# Patient Record
Sex: Female | Born: 1945
Health system: Southern US, Community
[De-identification: ages and names within clinical notes are randomized; demographics above are authoritative.]

## PROBLEM LIST (undated history)

## (undated) DIAGNOSIS — I499 Cardiac arrhythmia, unspecified: Secondary | ICD-10-CM

## (undated) DIAGNOSIS — M4800 Spinal stenosis, site unspecified: Secondary | ICD-10-CM

## (undated) DIAGNOSIS — R9409 Abnormal results of other function studies of central nervous system: Secondary | ICD-10-CM

## (undated) DIAGNOSIS — I10 Essential (primary) hypertension: Secondary | ICD-10-CM

## (undated) HISTORY — PX: BREAST SURGERY: SHX581

## (undated) HISTORY — PX: THORACIC OUTLET SURGERY: SHX2502

## (undated) HISTORY — PX: CATARACT EXTRACTION: SUR2

## (undated) HISTORY — PX: TUBAL LIGATION: SHX77

---

## 2014-10-05 ENCOUNTER — Encounter (HOSPITAL_COMMUNITY): Payer: Self-pay

## 2014-10-05 ENCOUNTER — Emergency Department (HOSPITAL_COMMUNITY)
Admission: EM | Admit: 2014-10-05 | Discharge: 2014-10-05 | Disposition: A | Discharge status: Home / Self Care | Payer: Medicare Other | Attending: Emergency Medicine | Admitting: Emergency Medicine

## 2014-10-05 DIAGNOSIS — Z87820 Personal history of traumatic brain injury: Secondary | ICD-10-CM | POA: Insufficient documentation

## 2014-10-05 DIAGNOSIS — Z8739 Personal history of other diseases of the musculoskeletal system and connective tissue: Secondary | ICD-10-CM | POA: Diagnosis not present

## 2014-10-05 DIAGNOSIS — Z79899 Other long term (current) drug therapy: Secondary | ICD-10-CM | POA: Diagnosis not present

## 2014-10-05 DIAGNOSIS — R5383 Other fatigue: Secondary | ICD-10-CM | POA: Diagnosis present

## 2014-10-05 DIAGNOSIS — I10 Essential (primary) hypertension: Secondary | ICD-10-CM | POA: Diagnosis not present

## 2014-10-05 DIAGNOSIS — R55 Syncope and collapse: Secondary | ICD-10-CM | POA: Insufficient documentation

## 2014-10-05 HISTORY — DX: Abnormal results of other function studies of central nervous system: R94.09

## 2014-10-05 HISTORY — DX: Cardiac arrhythmia, unspecified: I49.9

## 2014-10-05 HISTORY — DX: Essential (primary) hypertension: I10

## 2014-10-05 HISTORY — DX: Spinal stenosis, site unspecified: M48.00

## 2014-10-05 LAB — CBC WITH DIFFERENTIAL/PLATELET
Basophils Absolute: 0 10*3/uL (ref 0.0–0.1)
Basophils Relative: 0 % (ref 0–1)
Eosinophils Absolute: 0.2 10*3/uL (ref 0.0–0.7)
Eosinophils Relative: 3 % (ref 0–5)
HCT: 42.3 % (ref 36.0–46.0)
Hemoglobin: 14.3 g/dL (ref 12.0–15.0)
Lymphocytes Relative: 24 % (ref 12–46)
Lymphs Abs: 1.5 10*3/uL (ref 0.7–4.0)
MCH: 29.4 pg (ref 26.0–34.0)
MCHC: 33.8 g/dL (ref 30.0–36.0)
MCV: 87 fL (ref 78.0–100.0)
Monocytes Absolute: 0.3 10*3/uL (ref 0.1–1.0)
Monocytes Relative: 6 % (ref 3–12)
Neutro Abs: 4 10*3/uL (ref 1.7–7.7)
Neutrophils Relative %: 67 % (ref 43–77)
Platelets: 209 10*3/uL (ref 150–400)
RBC: 4.86 MIL/uL (ref 3.87–5.11)
RDW: 12.7 % (ref 11.5–15.5)
WBC: 6 10*3/uL (ref 4.0–10.5)

## 2014-10-05 LAB — COMPREHENSIVE METABOLIC PANEL
ALT: 12 U/L (ref 0–35)
AST: 19 U/L (ref 0–37)
Albumin: 4.1 g/dL (ref 3.5–5.2)
Alkaline Phosphatase: 86 U/L (ref 39–117)
Anion gap: 12 (ref 5–15)
BUN: 12 mg/dL (ref 6–23)
CO2: 24 mEq/L (ref 19–32)
Calcium: 9.5 mg/dL (ref 8.4–10.5)
Chloride: 103 mEq/L (ref 96–112)
Creatinine, Ser: 0.84 mg/dL (ref 0.50–1.10)
GFR calc Af Amer: 83 mL/min — ABNORMAL LOW (ref >90)
GFR calc non Af Amer: 71 mL/min — ABNORMAL LOW (ref >90)
Glucose, Bld: 120 mg/dL — ABNORMAL HIGH (ref 70–99)
Potassium: 4.5 mEq/L (ref 3.7–5.3)
Sodium: 139 mEq/L (ref 137–147)
Total Bilirubin: 0.5 mg/dL (ref 0.3–1.2)
Total Protein: 7.5 g/dL (ref 6.0–8.3)

## 2014-10-05 LAB — URINALYSIS, ROUTINE W REFLEX MICROSCOPIC
Bilirubin Urine: NEGATIVE
Glucose, UA: NEGATIVE mg/dL
Hgb urine dipstick: NEGATIVE
Ketones, ur: NEGATIVE mg/dL
Leukocytes, UA: NEGATIVE
Nitrite: NEGATIVE
Protein, ur: NEGATIVE mg/dL
Specific Gravity, Urine: 1.008 (ref 1.005–1.030)
Urobilinogen, UA: 1 mg/dL (ref 0.0–1.0)
pH: 5.5 (ref 5.0–8.0)

## 2014-10-05 LAB — I-STAT TROPONIN, ED: Troponin i, poc: 0 ng/mL (ref 0.00–0.08)

## 2014-10-05 MED ORDER — SODIUM CHLORIDE 0.9 % IV BOLUS (SEPSIS)
1000.0000 mL | Freq: Once | INTRAVENOUS | Status: AC
  Administered 2014-10-05: 1000 mL via INTRAVENOUS

## 2014-10-05 NOTE — ED Notes (Addendum)
[place]. States [place] was at the bank about 1120 this am and suddenly didn't feel right.  States she got really hot and like her head switched off causing her to go limp.  States she was awake the whole time and remembers everything.  Bystanders called ems.  Denies having had pain nor has pain now.  States she felt like air being let out of a tire but felt better soon afterwards and states she feels fine now.  States this has happened to her 3 different times in the past.  Last time was 8-10 years ago but no one could ever figure out what caused it.
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
## 2014-10-05 NOTE — ED Notes (Signed)
Bed: WA09 Expected date:  Expected time:  Means of arrival:  Comments: "her head switched off", no syncope

## 2014-10-05 NOTE — Discharge Instructions (Signed)
Return here as needed.  Follow-up with doctor Provided or one of your choosing.  Increase your fluid intake

## 2014-10-07 LAB — URINE CULTURE: Colony Count: 40000

## 2017-12-02 ENCOUNTER — Encounter: Payer: Self-pay | Admitting: Gastroenterology

## 2018-01-06 ENCOUNTER — Ambulatory Visit: Payer: Medicare Other | Admitting: Gastroenterology

## 2020-01-03 ENCOUNTER — Encounter: Payer: Self-pay | Admitting: *Deleted

## 2020-01-03 ENCOUNTER — Ambulatory Visit: Payer: Medicare Other | Admitting: Cardiovascular Disease

## 2020-01-03 ENCOUNTER — Encounter (INDEPENDENT_AMBULATORY_CARE_PROVIDER_SITE_OTHER): Payer: Self-pay

## 2020-01-03 ENCOUNTER — Encounter: Payer: Self-pay | Admitting: Cardiovascular Disease

## 2020-01-03 ENCOUNTER — Ambulatory Visit (INDEPENDENT_AMBULATORY_CARE_PROVIDER_SITE_OTHER): Payer: Medicare Other | Admitting: Cardiovascular Disease

## 2020-01-03 ENCOUNTER — Other Ambulatory Visit: Payer: Self-pay

## 2020-01-03 VITALS — BP 120/90 | HR 72 | Temp 97.2°F | Ht 60.0 in | Wt 144.0 lb

## 2020-01-03 DIAGNOSIS — R002 Palpitations: Secondary | ICD-10-CM

## 2020-01-03 DIAGNOSIS — I10 Essential (primary) hypertension: Secondary | ICD-10-CM | POA: Diagnosis not present

## 2020-01-03 DIAGNOSIS — I471 Supraventricular tachycardia: Secondary | ICD-10-CM

## 2020-01-03 DIAGNOSIS — E782 Mixed hyperlipidemia: Secondary | ICD-10-CM | POA: Diagnosis not present

## 2020-01-03 NOTE — Patient Instructions (Signed)
Medication Instructions:  Your physician recommends that you continue on your current medications as directed. Please refer to the Current Medication list given to you today.  *If you need a refill on your cardiac medications before your next appointment, please call your pharmacy*  Lab Work: NONE   Testing/Procedures: ZIO PATCH FOR 3 DAYS  Follow-Up: At [facility], you and your health needs are our priority.  As part of our continuing mission to provide you with exceptional heart care, we have created designated Provider Care Teams.  These Care Teams include your primary Cardiologist (physician) and Advanced Practice Providers (APPs -  Physician Assistants and Nurse Practitioners) who all work together to provide you with the care you need, when you need it.  Your next appointment:   2 MONTHS   The format for your next appointment:   In Person  Provider:   You may see DR [facility]  or one of the following Advanced Practice Providers on your designated Care Team:    Kerin Ransom, PA-C  [place], [place]  Coletta Memos, [place]   Other Instructions  [place] Monitor Instructions   Your physician has requested you wear your ZIO patch monitor_______days.   This is a single patch monitor.  [employer] supplies one patch monitor per enrollment.  Additional stickers are not available.   Please do not apply patch if you will be having a Nuclear Stress Test, Echocardiogram, Cardiac CT, MRI, or Chest Xray during the time frame you would be wearing the monitor. The patch cannot be worn during these tests.  You cannot remove and re-apply the ZIO XT patch monitor.   Your ZIO patch monitor will be sent USPS Priority mail from [facility] directly to your home address. The monitor may also be mailed to a PO BOX if home delivery is not available.   It may take 3-5 days to receive your monitor after you have been enrolled.   Once you have received you monitor, please review  enclosed instructions.  Your monitor has already been registered assigning a specific monitor serial # to you.   Applying the monitor   Shave hair from upper left chest.   Hold abrader disc by orange tab.  Rub abrader in 40 strokes over left upper chest as indicated in your monitor instructions.   Clean area with 4 enclosed alcohol pads .  Use all pads to assure are is cleaned thoroughly.  Let dry.   Apply patch as indicated in monitor instructions.  Patch will be place under collarbone on left side of chest with arrow pointing upward.   Rub patch adhesive wings for 2 minutes.Remove white label marked "1".  Remove white label marked "2".  Rub patch adhesive wings for 2 additional minutes.   While looking in a mirror, press and release button in center of patch.  A small green light will flash 3-4 times .  This will be your only indicator the monitor has been turned on.     Do not shower for the first 24 hours.  You may shower after the first 24 hours.   Press button if you feel a symptom. You will hear a small click.  Record Date, Time and Symptom in the Patient Log Book.   When you are ready to remove patch, follow instructions on last 2 pages of Patient Log Book.  Stick patch monitor onto last page of Patient Log Book.   Place Patient Log Book in [place] box.  Use  locking tab on box and tape box closed securely.  The [employer] has [employer] on it.  Please place in mailbox as soon as possible.  Your physician should have your test results approximately 7 days after the monitor has been mailed back to [facility].   Call [facility] Customer Care at [phone] if you have questions regarding your ZIO XT patch monitor.  Call them immediately if you see an orange light blinking on your monitor.   If your monitor falls off in less than 4 days contact our Monitor department at [phone].  If your monitor becomes loose or falls off after 4 days call [employer] at  [phone] for suggestions on securing your monitor.
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
## 2020-01-03 NOTE — Progress Notes (Signed)
Patient ID: Sara Strickland, female   DOB: [birth year], 74 y.o.   MRN: [id number] Patient enrolled for 3 day ZIO XT long term holter monitor to be mailed to her home.

## 2020-01-09 ENCOUNTER — Encounter: Payer: Self-pay | Admitting: Neurology

## 2020-01-09 ENCOUNTER — Other Ambulatory Visit: Payer: Self-pay

## 2020-01-09 ENCOUNTER — Ambulatory Visit: Payer: Medicare Other | Admitting: Neurology

## 2020-01-11 ENCOUNTER — Other Ambulatory Visit: Payer: Self-pay

## 2020-01-11 ENCOUNTER — Other Ambulatory Visit (INDEPENDENT_AMBULATORY_CARE_PROVIDER_SITE_OTHER): Payer: Medicare Other

## 2020-01-11 ENCOUNTER — Ambulatory Visit: Payer: Medicare Other | Admitting: Neurology

## 2020-01-11 ENCOUNTER — Encounter: Payer: Self-pay | Admitting: Neurology

## 2020-01-11 VITALS — BP 122/84 | HR 61 | Temp 97.0°F | Ht 60.0 in | Wt 143.0 lb

## 2020-01-11 DIAGNOSIS — G250 Essential tremor: Secondary | ICD-10-CM | POA: Diagnosis not present

## 2020-01-11 DIAGNOSIS — R002 Palpitations: Secondary | ICD-10-CM | POA: Diagnosis not present

## 2020-01-11 NOTE — Patient Instructions (Signed)
You have a Hand tremor of both hands as well as your head, your history and examination are supportive of the diagnosis of essential tremor. I do not see any signs or symptoms of parkinson's like disease or what we call parkinsonism.   For your tremor, I would not recommend any new medication for fear of side effects (especially Since you have already tried a few medications and had side effects with primidone.  You are currently on a beta-blocker which is also considered a first-line treatment for essential tremor.  We can continue to monitor your symptoms and examination.  Please follow-up in about 6 months to see one of our nurse practitioners.  Please remember, that any kind of tremor may be exacerbated by anxiety, anger, nervousness, excitement, dehydration, sleep deprivation, by caffeine, Thyroid disease, and low blood sugar values or blood sugar fluctuations. Some medications can exacerbate tremors.
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
## 2020-01-20 ENCOUNTER — Encounter: Payer: Self-pay | Admitting: Cardiovascular Disease

## 2020-01-20 DIAGNOSIS — R002 Palpitations: Secondary | ICD-10-CM

## 2020-01-20 DIAGNOSIS — I471 Supraventricular tachycardia: Secondary | ICD-10-CM

## 2020-01-20 DIAGNOSIS — I10 Essential (primary) hypertension: Secondary | ICD-10-CM

## 2020-01-20 DIAGNOSIS — E785 Hyperlipidemia, unspecified: Secondary | ICD-10-CM

## 2020-01-20 HISTORY — DX: Hyperlipidemia, unspecified: E78.5

## 2020-01-20 HISTORY — DX: Essential (primary) hypertension: I10

## 2020-01-20 HISTORY — DX: Supraventricular tachycardia: I47.1

## 2020-01-20 HISTORY — DX: Palpitations: R00.2

## 2020-02-02 ENCOUNTER — Ambulatory Visit: Payer: Medicare Other | Attending: Internal Medicine

## 2020-02-02 DIAGNOSIS — Z23 Encounter for immunization: Secondary | ICD-10-CM

## 2020-02-02 NOTE — Progress Notes (Signed)
   Covid-19 Vaccination Clinic  Name:  Sara Strickland    MRN: [id number] DOB: 06/23/1946  02/02/2020  [name] was observed post Covid-19 immunization for 15 minutes without incident. She was provided with Vaccine Information Sheet and instruction to access the V-Safe system.   [name] was instructed to call 911 with any severe reactions post vaccine: Marland Kitchen Difficulty breathing  . Swelling of face and throat  . A fast heartbeat  . A bad rash all over body  . Dizziness and weakness   Immunizations Administered    Name Date Dose VIS Date Route   Pfizer COVID-19 Vaccine 02/02/2020  3:27 PM 0.3 mL 11/04/2019 Intramuscular   Manufacturer: [employer], [employer]   Lot: [id number]   NDC: [address]-0

## 2020-02-28 ENCOUNTER — Ambulatory Visit: Payer: Medicare Other | Attending: Internal Medicine

## 2020-02-28 DIAGNOSIS — Z23 Encounter for immunization: Secondary | ICD-10-CM

## 2020-02-28 NOTE — Progress Notes (Signed)
   Covid-19 Vaccination Clinic  Name:  Sara Strickland    MRN: [id number] DOB: 1946/04/16  02/28/2020  [name] was observed post Covid-19 immunization for 15 minutes without incident. She was provided with Vaccine Information Sheet and instruction to access the V-Safe system.   [name] was instructed to call 911 with any severe reactions post vaccine: Marland Kitchen Difficulty breathing  . Swelling of face and throat  . A fast heartbeat  . A bad rash all over body  . Dizziness and weakness   Immunizations Administered    Name Date Dose VIS Date Route   Pfizer COVID-19 Vaccine 02/28/2020 10:59 AM 0.3 mL 11/04/2019 Intramuscular   Manufacturer: [employer], [employer]   Lot: [id number]   NDC: [address]-0

## 2020-03-02 ENCOUNTER — Other Ambulatory Visit: Payer: Self-pay

## 2020-03-02 ENCOUNTER — Encounter: Payer: Self-pay | Admitting: Cardiovascular Disease

## 2020-03-02 ENCOUNTER — Ambulatory Visit: Payer: Medicare Other | Admitting: Cardiovascular Disease

## 2020-03-02 VITALS — BP 120/74 | HR 97 | Ht 60.0 in | Wt 141.4 lb

## 2020-03-02 DIAGNOSIS — I10 Essential (primary) hypertension: Secondary | ICD-10-CM

## 2020-03-02 DIAGNOSIS — E782 Mixed hyperlipidemia: Secondary | ICD-10-CM

## 2020-03-02 DIAGNOSIS — I471 Supraventricular tachycardia: Secondary | ICD-10-CM

## 2020-03-02 NOTE — Patient Instructions (Signed)
Medication Instructions:  Your physician recommends that you continue on your current medications as directed. Please refer to the Current Medication list given to you today.  *If you need a refill on your cardiac medications before your next appointment, please call your pharmacy*  Lab Work: NONE   Testing/Procedures: NONE   Follow-Up: At [facility], you and your health needs are our priority.  As part of our continuing mission to provide you with exceptional heart care, we have created designated Provider Care Teams.  These Care Teams include your primary Cardiologist (physician) and Advanced Practice Providers (APPs -  Physician Assistants and Nurse Practitioners) who all work together to provide you with the care you need, when you need it.  We recommend signing up for the patient portal called "MyChart".  Sign up information is provided on this After Visit Summary.  MyChart is used to connect with patients for Virtual Visits (Telemedicine).  Patients are able to view lab/test results, encounter notes, upcoming appointments, etc.  Non-urgent messages can be sent to your provider as well.   To learn more about what you can do with MyChart, go to [email or web address].    Your next appointment:   12 month(s)  The format for your next appointment:   In Person  Provider:   You may see DR [place] or one of the following Advanced Practice Providers on your designated Care Team:    Luke Kilroy, PA-C  Callie Goodrich, PA-C  Jesse Cleaver, FNP     
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
## 2020-04-06 ENCOUNTER — Other Ambulatory Visit: Payer: Self-pay | Admitting: Family Medicine

## 2020-04-06 DIAGNOSIS — Z1231 Encounter for screening mammogram for malignant neoplasm of breast: Secondary | ICD-10-CM

## 2020-04-10 ENCOUNTER — Other Ambulatory Visit: Payer: Self-pay

## 2020-04-10 ENCOUNTER — Ambulatory Visit
Admission: RE | Admit: 2020-04-10 | Discharge: 2020-04-10 | Disposition: A | Discharge status: Home / Self Care | Payer: Medicare Other | Source: Ambulatory Visit | Attending: Family Medicine | Admitting: Family Medicine

## 2020-04-10 DIAGNOSIS — Z1231 Encounter for screening mammogram for malignant neoplasm of breast: Secondary | ICD-10-CM

## 2020-06-05 IMAGING — MG DIGITAL SCREENING BILAT W/ TOMO W/ CAD
6 of 10 series · 6 of 30 positions shown · non-contrast
Comparison: Previous exam(s).

CLINICAL DATA: Screening.

EXAM:
DIGITAL SCREENING BILATERAL MAMMOGRAM WITH TOMO AND CAD

[R MLO synth-2D]
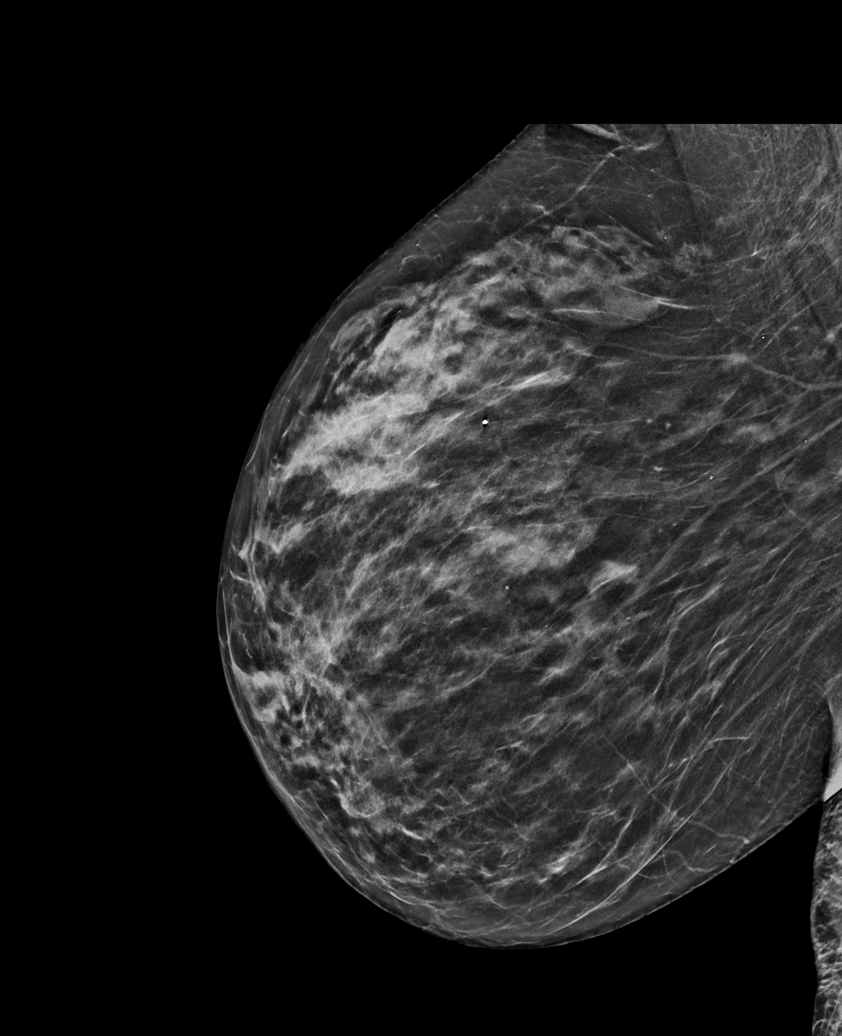

[L MLO synth-2D (1 of 2)]
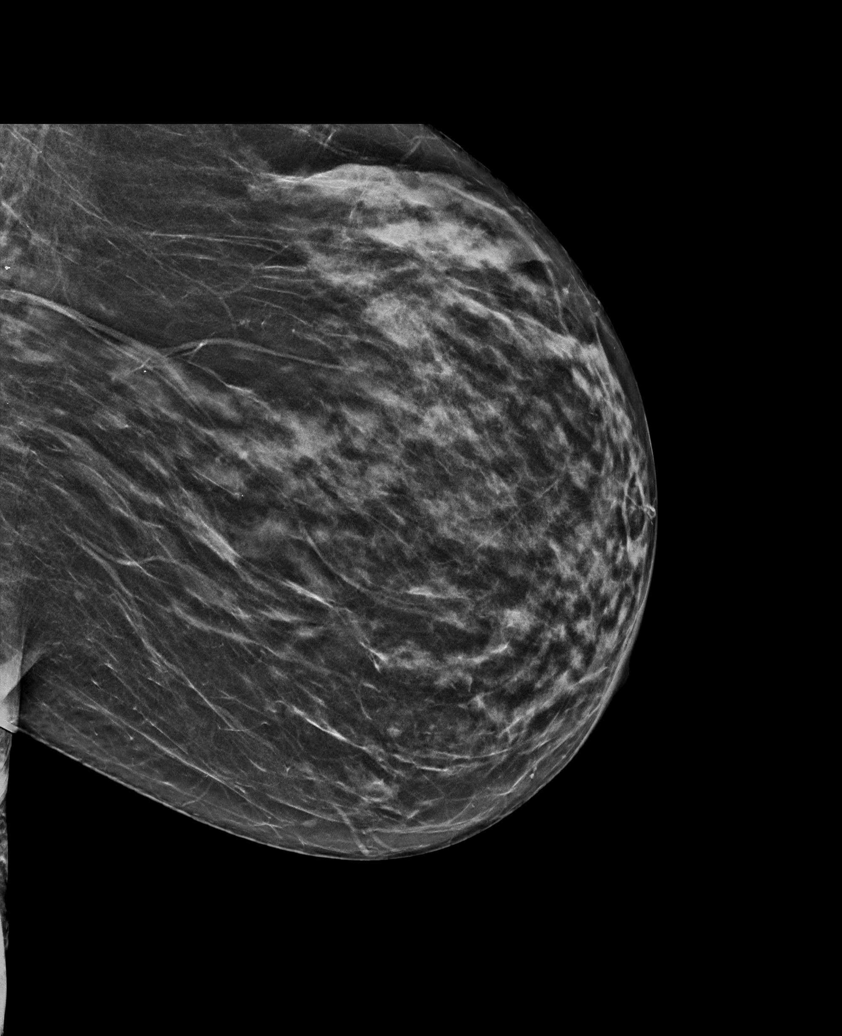

[R CC synth-2D]
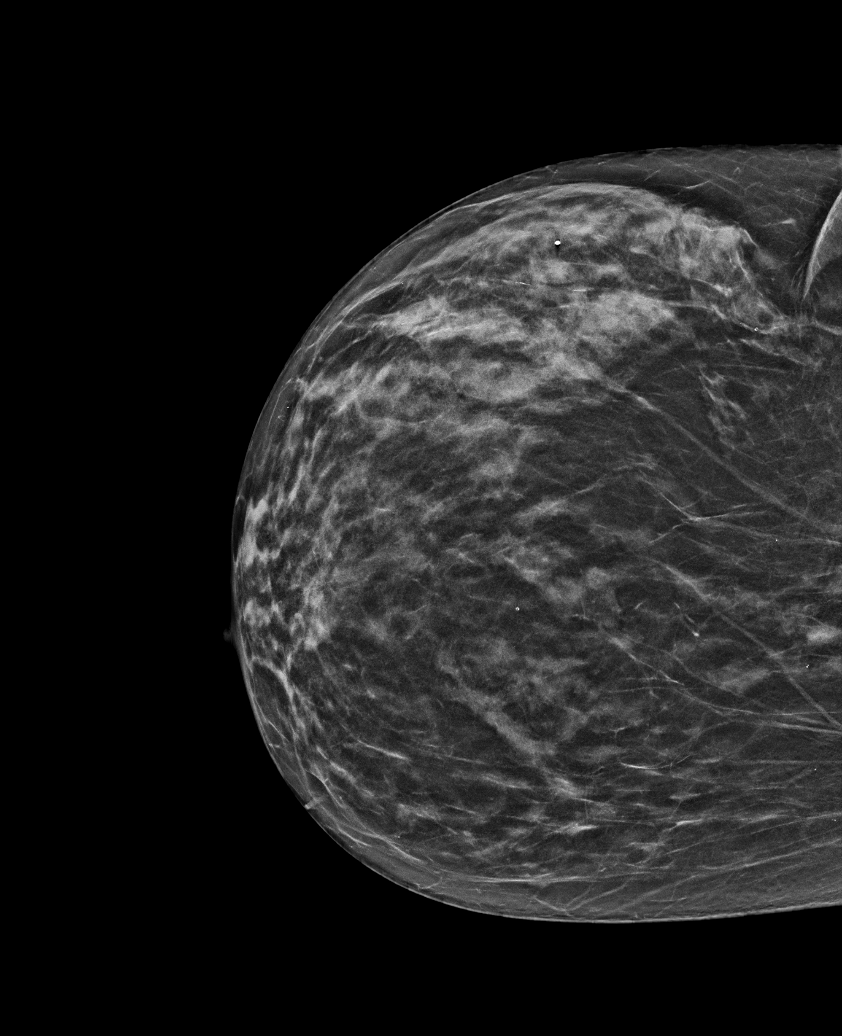

[L CC synth-2D]
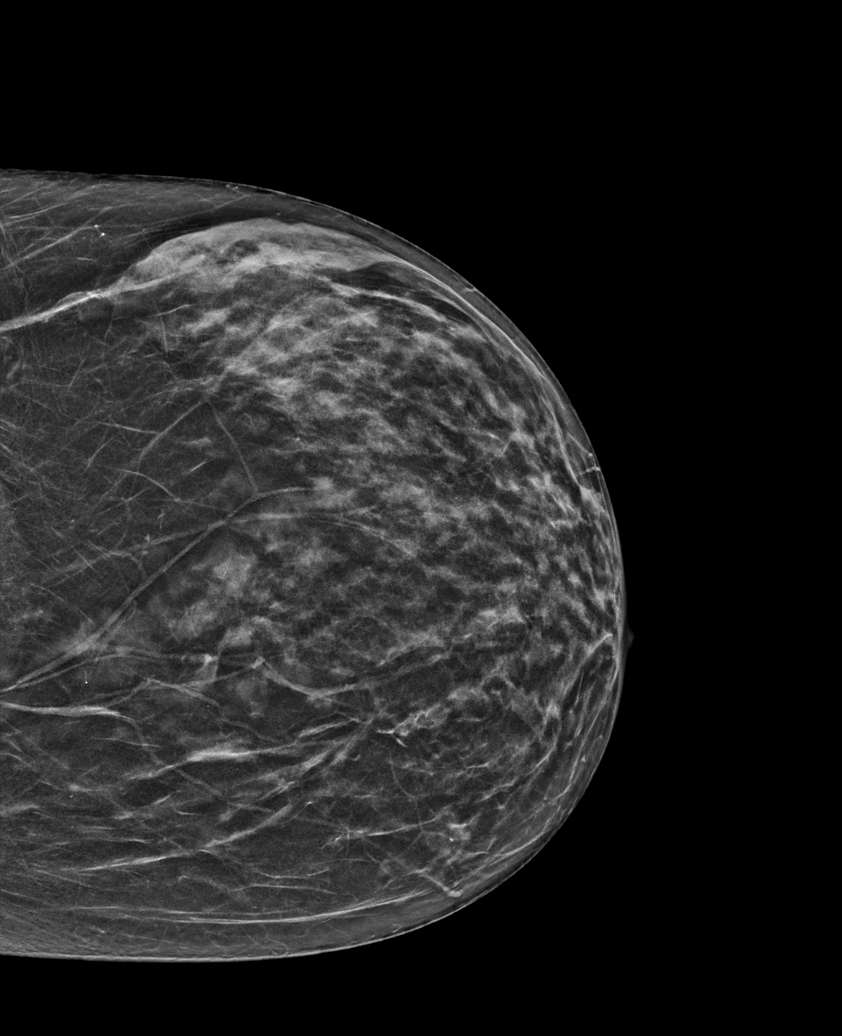

[L MLO synth-2D (2 of 2)]
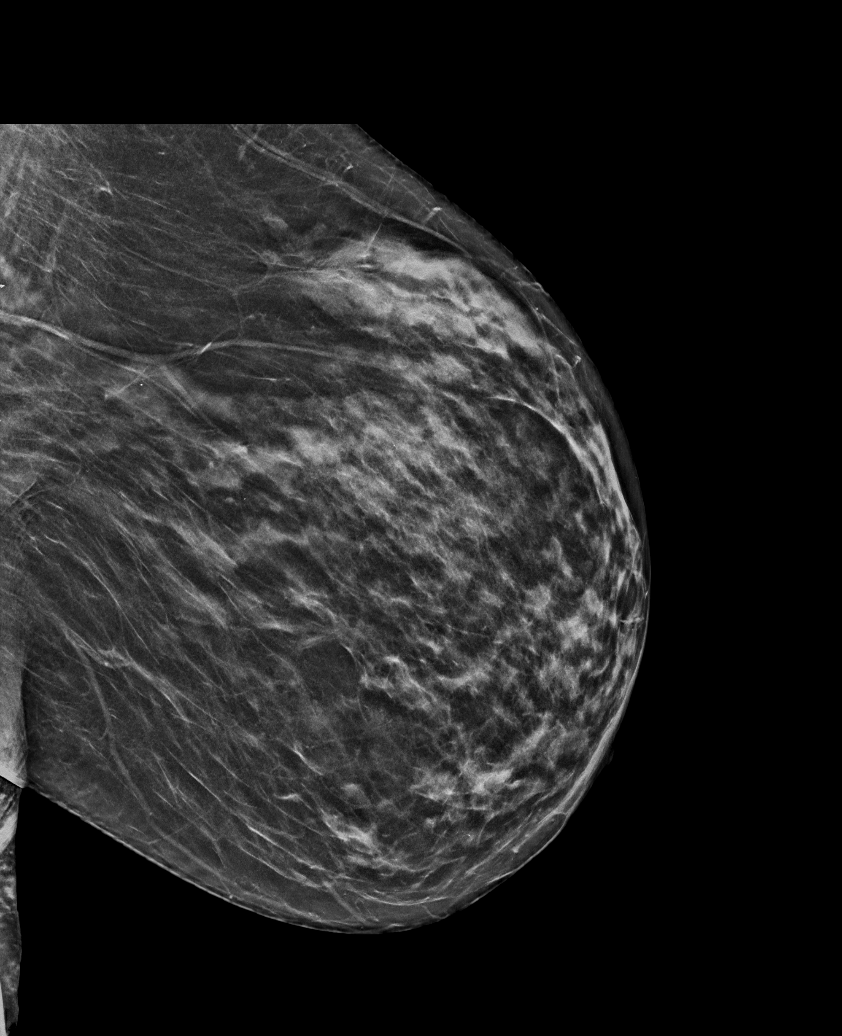

[L MLO tomo · tomo slice 28/55.0]
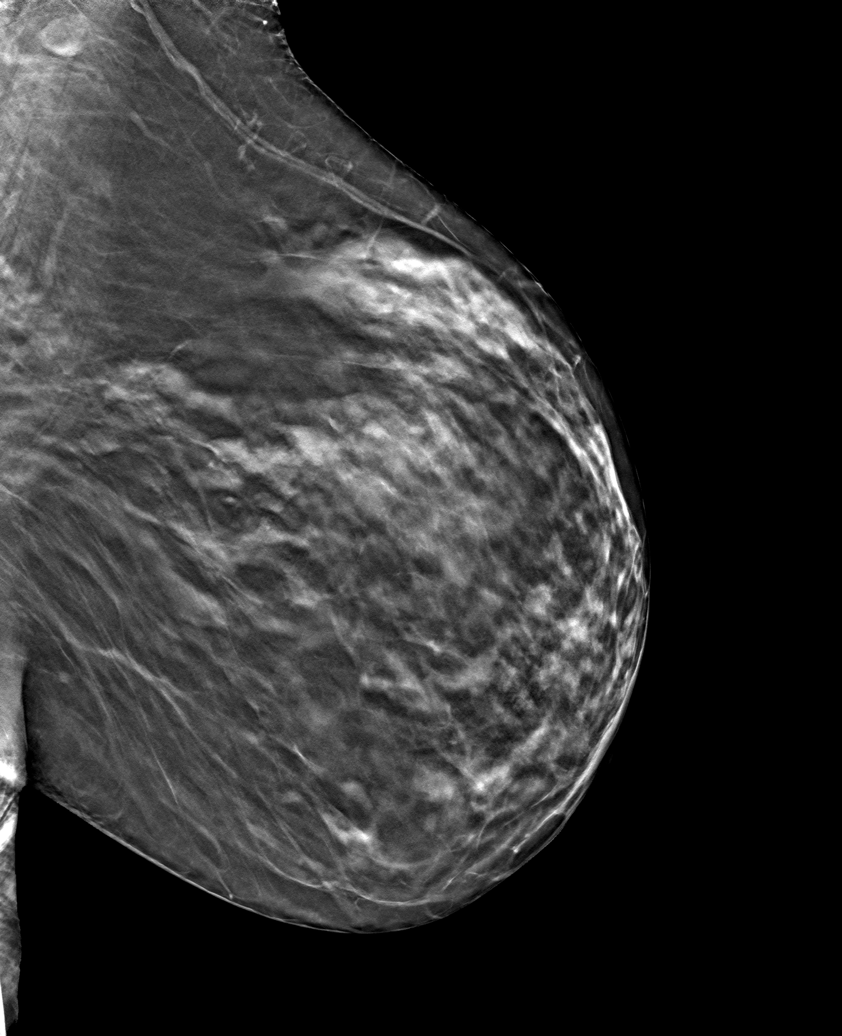

[6 of 30 positions shown; findings below may reference images not displayed]

ACR Breast Density Category c: The breast tissue is heterogeneously
dense, which may obscure small masses.
FINDINGS: There are no findings suspicious for malignancy. Images were
processed with CAD.
IMPRESSION: No mammographic evidence of malignancy. A result letter of this
screening mammogram will be mailed directly to the patient.

RECOMMENDATION:
Screening mammogram in one year. (Code:[id number]-[id number])

BI-RADS CATEGORY  1: Negative.

## 2020-07-11 ENCOUNTER — Ambulatory Visit: Payer: Medicare Other | Admitting: Adult Health

## 2020-09-25 ENCOUNTER — Ambulatory Visit: Payer: Medicare Other | Admitting: Adult Health

## 2020-09-25 ENCOUNTER — Encounter: Payer: Self-pay | Admitting: Adult Health

## 2020-09-25 ENCOUNTER — Other Ambulatory Visit: Payer: Self-pay

## 2020-09-25 VITALS — BP 126/70 | Ht 60.0 in | Wt 136.4 lb

## 2020-09-25 DIAGNOSIS — G250 Essential tremor: Secondary | ICD-10-CM

## 2020-09-25 DIAGNOSIS — R55 Syncope and collapse: Secondary | ICD-10-CM | POA: Diagnosis not present
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
## 2020-09-25 NOTE — Patient Instructions (Signed)
Your Plan:  Continue to monitor symptoms  If you have any addition events please let us know If your symptoms worsen or you develop new symptoms please let us know.    Thank you for coming to see [place] at [facility]. I hope we have been able to provide you high quality care today.  You may receive a patient satisfaction survey over the next few weeks. We would appreciate your feedback and comments so that we may continue to improve ourselves and the health of our patients.

## 2021-02-06 DIAGNOSIS — H02831 Dermatochalasis of right upper eyelid: Secondary | ICD-10-CM | POA: Diagnosis not present

## 2021-02-06 DIAGNOSIS — H43813 Vitreous degeneration, bilateral: Secondary | ICD-10-CM | POA: Diagnosis not present

## 2021-02-06 DIAGNOSIS — E119 Type 2 diabetes mellitus without complications: Secondary | ICD-10-CM | POA: Diagnosis not present

## 2021-02-06 DIAGNOSIS — Z961 Presence of intraocular lens: Secondary | ICD-10-CM | POA: Diagnosis not present

## 2021-02-06 DIAGNOSIS — H532 Diplopia: Secondary | ICD-10-CM | POA: Diagnosis not present

## 2021-02-06 DIAGNOSIS — H35372 Puckering of macula, left eye: Secondary | ICD-10-CM | POA: Diagnosis not present

## 2021-02-06 DIAGNOSIS — H02834 Dermatochalasis of left upper eyelid: Secondary | ICD-10-CM | POA: Diagnosis not present

## 2021-02-06 DIAGNOSIS — H25811 Combined forms of age-related cataract, right eye: Secondary | ICD-10-CM | POA: Diagnosis not present

## 2021-02-20 DIAGNOSIS — E1122 Type 2 diabetes mellitus with diabetic chronic kidney disease: Secondary | ICD-10-CM | POA: Diagnosis not present

## 2021-02-20 DIAGNOSIS — M810 Age-related osteoporosis without current pathological fracture: Secondary | ICD-10-CM | POA: Diagnosis not present

## 2021-02-20 DIAGNOSIS — N1831 Chronic kidney disease, stage 3a: Secondary | ICD-10-CM | POA: Diagnosis not present

## 2021-02-20 DIAGNOSIS — I10 Essential (primary) hypertension: Secondary | ICD-10-CM | POA: Diagnosis not present

## 2021-02-20 DIAGNOSIS — E7800 Pure hypercholesterolemia, unspecified: Secondary | ICD-10-CM | POA: Diagnosis not present

## 2021-02-20 DIAGNOSIS — I129 Hypertensive chronic kidney disease with stage 1 through stage 4 chronic kidney disease, or unspecified chronic kidney disease: Secondary | ICD-10-CM | POA: Diagnosis not present

## 2021-02-20 DIAGNOSIS — K219 Gastro-esophageal reflux disease without esophagitis: Secondary | ICD-10-CM | POA: Diagnosis not present

## 2021-03-14 DIAGNOSIS — Z0000 Encounter for general adult medical examination without abnormal findings: Secondary | ICD-10-CM | POA: Diagnosis not present

## 2021-03-15 ENCOUNTER — Other Ambulatory Visit: Payer: Self-pay | Admitting: Family Medicine

## 2021-03-15 DIAGNOSIS — Z1231 Encounter for screening mammogram for malignant neoplasm of breast: Secondary | ICD-10-CM

## 2021-03-19 ENCOUNTER — Ambulatory Visit: Payer: Medicare Other | Admitting: Cardiovascular Disease

## 2021-03-25 ENCOUNTER — Ambulatory Visit: Payer: Medicare Other | Admitting: Adult Health

## 2021-03-25 DIAGNOSIS — M810 Age-related osteoporosis without current pathological fracture: Secondary | ICD-10-CM | POA: Diagnosis not present

## 2021-03-27 ENCOUNTER — Telehealth: Payer: Self-pay

## 2021-03-27 ENCOUNTER — Ambulatory Visit: Payer: Medicare Other | Admitting: Neurology

## 2021-03-27 ENCOUNTER — Ambulatory Visit: Payer: Medicare Other | Admitting: Adult Health

## 2021-03-27 NOTE — Telephone Encounter (Signed)
Patient no showed 03/27/21 appt with Dr. Frances Furbish

## 2021-04-10 DIAGNOSIS — I129 Hypertensive chronic kidney disease with stage 1 through stage 4 chronic kidney disease, or unspecified chronic kidney disease: Secondary | ICD-10-CM | POA: Diagnosis not present

## 2021-04-10 DIAGNOSIS — N1831 Chronic kidney disease, stage 3a: Secondary | ICD-10-CM | POA: Diagnosis not present

## 2021-04-10 DIAGNOSIS — E7800 Pure hypercholesterolemia, unspecified: Secondary | ICD-10-CM | POA: Diagnosis not present

## 2021-04-10 DIAGNOSIS — I471 Supraventricular tachycardia: Secondary | ICD-10-CM | POA: Diagnosis not present

## 2021-04-10 DIAGNOSIS — M810 Age-related osteoporosis without current pathological fracture: Secondary | ICD-10-CM | POA: Diagnosis not present

## 2021-04-10 DIAGNOSIS — Z6824 Body mass index (BMI) 24.0-24.9, adult: Secondary | ICD-10-CM | POA: Diagnosis not present

## 2021-04-10 DIAGNOSIS — G250 Essential tremor: Secondary | ICD-10-CM | POA: Diagnosis not present

## 2021-04-10 DIAGNOSIS — K219 Gastro-esophageal reflux disease without esophagitis: Secondary | ICD-10-CM | POA: Diagnosis not present

## 2021-04-10 DIAGNOSIS — E1122 Type 2 diabetes mellitus with diabetic chronic kidney disease: Secondary | ICD-10-CM | POA: Diagnosis not present

## 2021-04-10 DIAGNOSIS — K5900 Constipation, unspecified: Secondary | ICD-10-CM | POA: Diagnosis not present

## 2021-04-17 DIAGNOSIS — E1122 Type 2 diabetes mellitus with diabetic chronic kidney disease: Secondary | ICD-10-CM | POA: Diagnosis not present

## 2021-04-17 DIAGNOSIS — E7800 Pure hypercholesterolemia, unspecified: Secondary | ICD-10-CM | POA: Diagnosis not present

## 2021-04-17 DIAGNOSIS — N1831 Chronic kidney disease, stage 3a: Secondary | ICD-10-CM | POA: Diagnosis not present

## 2021-04-17 DIAGNOSIS — I129 Hypertensive chronic kidney disease with stage 1 through stage 4 chronic kidney disease, or unspecified chronic kidney disease: Secondary | ICD-10-CM | POA: Diagnosis not present

## 2021-04-17 DIAGNOSIS — K219 Gastro-esophageal reflux disease without esophagitis: Secondary | ICD-10-CM | POA: Diagnosis not present

## 2021-04-17 DIAGNOSIS — M810 Age-related osteoporosis without current pathological fracture: Secondary | ICD-10-CM | POA: Diagnosis not present

## 2021-04-19 ENCOUNTER — Ambulatory Visit: Payer: Medicare Other | Admitting: Cardiovascular Disease

## 2021-04-19 ENCOUNTER — Other Ambulatory Visit: Payer: Self-pay

## 2021-04-19 ENCOUNTER — Ambulatory Visit: Payer: Medicare Other

## 2021-04-19 ENCOUNTER — Encounter: Payer: Self-pay | Admitting: Cardiovascular Disease

## 2021-04-19 DIAGNOSIS — G540 Brachial plexus disorders: Secondary | ICD-10-CM | POA: Diagnosis not present

## 2021-04-19 DIAGNOSIS — R42 Dizziness and giddiness: Secondary | ICD-10-CM | POA: Insufficient documentation

## 2021-04-19 DIAGNOSIS — I471 Supraventricular tachycardia, unspecified: Secondary | ICD-10-CM

## 2021-04-19 DIAGNOSIS — Z1231 Encounter for screening mammogram for malignant neoplasm of breast: Secondary | ICD-10-CM | POA: Diagnosis not present

## 2021-04-19 DIAGNOSIS — E782 Mixed hyperlipidemia: Secondary | ICD-10-CM | POA: Diagnosis not present

## 2021-04-19 DIAGNOSIS — I10 Essential (primary) hypertension: Secondary | ICD-10-CM

## 2021-04-19 HISTORY — DX: Dizziness and giddiness: R42

## 2021-04-19 HISTORY — DX: Brachial plexus disorders: G54.0

## 2021-04-19 NOTE — Assessment & Plan Note (Signed)
She is having episodes of dizziness when driving.  We will get a 3-day ZIO to make sure she is not having any arrhythmias or bradycardia contributing.  We will also get carotid Dopplers to assess for recurrent thoracic outlet syndrome.

## 2021-04-19 NOTE — Assessment & Plan Note (Signed)
Blood pressure was initially normal but increased significantly to 170/74 on repeat.  She thinks that it has generally been well-controlled at home.  She will track it and let us know if it is consistently running over 130/80.  For now, continue lisinopril and metoprolol at current doses.

## 2021-04-19 NOTE — Assessment & Plan Note (Signed)
Cholesterol levels are stable on simvastatin.  Encouraged her to increase her exercise to 150 minutes weekly.  She does not like to do any exercise.  When she has more stable access to a car she is willing to participate in the PR EP program.  We will follow-up at her next appointment.

## 2021-04-19 NOTE — Patient Instructions (Addendum)
Medication Instructions:  Your physician recommends that you continue on your current medications as directed. Please refer to the Current Medication list given to you today.   *If you need a refill on your cardiac medications before your next appointment, please call your pharmacy*  Lab Work: NONE  Testing/Procedures: Your physician has requested that you have a carotid duplex. This test is an ultrasound of the carotid arteries in your neck. It looks at blood flow through these arteries that supply the brain with blood. Allow one hour for this exam. There are no restrictions or special instructions.  3 DAY ZIO MONITOR   Follow-Up: At [facility], you and your health needs are our priority.  As part of our continuing mission to provide you with exceptional heart care, we have created designated Provider Care Teams.  These Care Teams include your primary Cardiologist (physician) and Advanced Practice Providers (APPs -  Physician Assistants and Nurse Practitioners) who all work together to provide you with the care you need, when you need it.  We recommend signing up for the patient portal called "MyChart".  Sign up information is provided on this After Visit Summary.  MyChart is used to connect with patients for Virtual Visits (Telemedicine).  Patients are able to view lab/test results, encounter notes, upcoming appointments, etc.  Non-urgent messages can be sent to your provider as well.   To learn more about what you can do with MyChart, go to [email or web address].    Your next appointment:   2-3 month(s)  The format for your next appointment:   In Person  Provider:   DR [facility] [place] [facility]   Other Instructions  MONITOR YOUR BLOOD PRESSURE AT HOME. CALL THE OFFICE IF IT IS CONSISTENTLY ABOVE 130/80  ZIO XT- Long Term Monitor Instructions   Your physician has requested you wear a ZIO patch monitor for _3_ days.  This is a single patch monitor.   [employer] supplies  one patch monitor per enrollment. Additional stickers are not available. Please do not apply patch if you will be having a Nuclear Stress Test, Echocardiogram, Cardiac CT, MRI, or Chest Xray during the period you would be wearing the monitor. The patch cannot be worn during these tests. You cannot remove and re-apply the ZIO XT patch monitor.  Your ZIO patch monitor will be sent Fed Ex from [employer] directly to your home address. It may take 3-5 days to receive your monitor after you have been enrolled.  Once you have received your monitor, please review the enclosed instructions. Your monitor has already been registered assigning a specific monitor serial # to you.  Billing and Patient Assistance Program Information   We have supplied [employer] with any of your insurance information on file for billing purposes. [employer] offers a sliding scale Patient Assistance Program for patients that do not have insurance, or whose insurance does not completely cover the cost of the ZIO monitor.   You must apply for the Patient Assistance Program to qualify for this discounted rate.     To apply, please call [employer] at [phone], select option 4, then select option 2, and ask to apply for Patient Assistance Program.  Meredeth Ide will ask your household income, and how many people are in your household.  They will quote your out-of-pocket cost based on that information.  [employer] will also be able to set up a 36-month, interest-free payment plan if needed.  Applying the monitor   Shave hair from upper left chest.  Hold  abrader disc by orange tab. Rub abrader in 40 strokes over the upper left chest as indicated in your monitor instructions.  Clean area with 4 enclosed alcohol pads. Let dry.  Apply patch as indicated in monitor instructions. Patch will be placed under collarbone on left side of chest with arrow pointing upward.  Rub patch adhesive wings for 2 minutes. Remove white label marked "1". Remove the  white label marked "2". Rub patch adhesive wings for 2 additional minutes.  While looking in a mirror, press and release button in center of patch. A small green light will flash 3-4 times. This will be your only indicator that the monitor has been turned on. ?  Do not shower for the first 24 hours. You may shower after the first 24 hours.  Press the button if you feel a symptom. You will hear a small click. Record Date, Time and Symptom in the Patient Logbook.  When you are ready to remove the patch, follow instructions on the last 2 pages of the Patient Logbook. Stick patch monitor onto the last page of Patient Logbook.  Place Patient Logbook in the blue and white box.  Use locking tab on box and tape box closed securely.  The blue and white box has prepaid postage on it. Please place it in the mailbox as soon as possible. Your physician should have your test results approximately 7 days after the monitor has been mailed back to [facility].  Call [facility] Customer Care at [phone] if you have questions regarding your ZIO XT patch monitor. Call them immediately if you see an orange light blinking on your monitor.  If your monitor falls off in less than 4 days, contact our Monitor department at [phone]. ?If your monitor becomes loose or falls off after 4 days call [employer] at [phone] for suggestions on securing your monitor.?

## 2021-04-19 NOTE — Progress Notes (Unsigned)
Enrolled patient for a 3 day Zio XT monitor to be mailed to patients home  

## 2021-04-19 NOTE — Assessment & Plan Note (Signed)
She previously had surgery to remove 1 rib on the right due to thoracic outlet syndrome.  Lately she has been experiencing episodes of dizziness while driving.  I am concerned that she could have some scar tissue or obstruction on the other side that could be contributing to her dizziness while driving, especially with turns in her head and elevation of her arms.  We will order carotid Dopplers to assess for recurrent stenosis.  She has not had any issues with swelling.
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
## 2021-04-19 NOTE — Assessment & Plan Note (Signed)
Stable.  Continue metoprolol.  We have been unable to escalate the dose in the past due to bradycardia.

## 2021-04-30 ENCOUNTER — Ambulatory Visit (HOSPITAL_COMMUNITY)
Admission: RE | Admit: 2021-04-30 | Discharge: 2021-04-30 | Disposition: A | Discharge status: Home / Self Care | Payer: Medicare Other | Source: Ambulatory Visit | Attending: Internal Medicine | Admitting: Internal Medicine

## 2021-04-30 ENCOUNTER — Other Ambulatory Visit: Payer: Self-pay

## 2021-04-30 DIAGNOSIS — G540 Brachial plexus disorders: Secondary | ICD-10-CM

## 2021-04-30 DIAGNOSIS — R42 Dizziness and giddiness: Secondary | ICD-10-CM

## 2021-05-06 ENCOUNTER — Ambulatory Visit: Payer: Medicare Other

## 2021-06-19 DIAGNOSIS — H02834 Dermatochalasis of left upper eyelid: Secondary | ICD-10-CM | POA: Diagnosis not present

## 2021-06-19 DIAGNOSIS — H02831 Dermatochalasis of right upper eyelid: Secondary | ICD-10-CM | POA: Diagnosis not present

## 2021-06-19 DIAGNOSIS — E7800 Pure hypercholesterolemia, unspecified: Secondary | ICD-10-CM | POA: Diagnosis not present

## 2021-06-19 DIAGNOSIS — H35372 Puckering of macula, left eye: Secondary | ICD-10-CM | POA: Diagnosis not present

## 2021-06-19 DIAGNOSIS — E1122 Type 2 diabetes mellitus with diabetic chronic kidney disease: Secondary | ICD-10-CM | POA: Diagnosis not present

## 2021-06-19 DIAGNOSIS — N1831 Chronic kidney disease, stage 3a: Secondary | ICD-10-CM | POA: Diagnosis not present

## 2021-06-19 DIAGNOSIS — H43813 Vitreous degeneration, bilateral: Secondary | ICD-10-CM | POA: Diagnosis not present

## 2021-06-19 DIAGNOSIS — M810 Age-related osteoporosis without current pathological fracture: Secondary | ICD-10-CM | POA: Diagnosis not present

## 2021-06-19 DIAGNOSIS — H43812 Vitreous degeneration, left eye: Secondary | ICD-10-CM | POA: Diagnosis not present

## 2021-06-19 DIAGNOSIS — H04123 Dry eye syndrome of bilateral lacrimal glands: Secondary | ICD-10-CM | POA: Diagnosis not present

## 2021-06-19 DIAGNOSIS — K219 Gastro-esophageal reflux disease without esophagitis: Secondary | ICD-10-CM | POA: Diagnosis not present

## 2021-06-19 DIAGNOSIS — I129 Hypertensive chronic kidney disease with stage 1 through stage 4 chronic kidney disease, or unspecified chronic kidney disease: Secondary | ICD-10-CM | POA: Diagnosis not present

## 2021-06-19 DIAGNOSIS — Z961 Presence of intraocular lens: Secondary | ICD-10-CM | POA: Diagnosis not present

## 2021-06-19 DIAGNOSIS — H25811 Combined forms of age-related cataract, right eye: Secondary | ICD-10-CM | POA: Diagnosis not present

## 2021-07-03 DIAGNOSIS — H02831 Dermatochalasis of right upper eyelid: Secondary | ICD-10-CM | POA: Diagnosis not present

## 2021-07-03 DIAGNOSIS — H04123 Dry eye syndrome of bilateral lacrimal glands: Secondary | ICD-10-CM | POA: Diagnosis not present

## 2021-07-03 DIAGNOSIS — H35372 Puckering of macula, left eye: Secondary | ICD-10-CM | POA: Diagnosis not present

## 2021-07-03 DIAGNOSIS — H02834 Dermatochalasis of left upper eyelid: Secondary | ICD-10-CM | POA: Diagnosis not present

## 2021-07-03 DIAGNOSIS — Z961 Presence of intraocular lens: Secondary | ICD-10-CM | POA: Diagnosis not present

## 2021-07-03 DIAGNOSIS — H43812 Vitreous degeneration, left eye: Secondary | ICD-10-CM | POA: Diagnosis not present

## 2021-07-03 DIAGNOSIS — H43813 Vitreous degeneration, bilateral: Secondary | ICD-10-CM | POA: Diagnosis not present

## 2021-07-03 DIAGNOSIS — H25811 Combined forms of age-related cataract, right eye: Secondary | ICD-10-CM | POA: Diagnosis not present
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
## 2021-07-08 ENCOUNTER — Ambulatory Visit (HOSPITAL_BASED_OUTPATIENT_CLINIC_OR_DEPARTMENT_OTHER): Payer: Medicare Other | Admitting: Family

## 2021-07-08 ENCOUNTER — Telehealth: Payer: Self-pay

## 2021-07-08 ENCOUNTER — Encounter (HOSPITAL_BASED_OUTPATIENT_CLINIC_OR_DEPARTMENT_OTHER): Payer: Self-pay | Admitting: Family

## 2021-07-08 ENCOUNTER — Other Ambulatory Visit: Payer: Self-pay

## 2021-07-08 VITALS — BP 128/58 | HR 58 | Ht 60.0 in | Wt 126.0 lb

## 2021-07-08 DIAGNOSIS — I471 Supraventricular tachycardia: Secondary | ICD-10-CM

## 2021-07-08 DIAGNOSIS — I10 Essential (primary) hypertension: Secondary | ICD-10-CM

## 2021-07-08 DIAGNOSIS — E782 Mixed hyperlipidemia: Secondary | ICD-10-CM | POA: Diagnosis not present

## 2021-07-08 DIAGNOSIS — G540 Brachial plexus disorders: Secondary | ICD-10-CM

## 2021-07-08 NOTE — Patient Instructions (Addendum)
Medication Instructions:  Continue your current medications.  *If you need a refill on your cardiac medications before your next appointment, please call your pharmacy*   Lab Work: None ordered today.   Testing/Procedures: None ordered today.    Follow-Up: At [facility], you and your health needs are our priority.  As part of our continuing mission to provide you with exceptional heart care, we have created designated Provider Care Teams.  These Care Teams include your primary Cardiologist (physician) and Advanced Practice Providers (APPs -  Physician Assistants and Nurse Practitioners) who all work together to provide you with the care you need, when you need it.  We recommend signing up for the patient portal called "MyChart".  Sign up information is provided on this After Visit Summary.  MyChart is used to connect with patients for Virtual Visits (Telemedicine).  Patients are able to view lab/test results, encounter notes, upcoming appointments, etc.  Non-urgent messages can be sent to your provider as well.   To learn more about what you can do with MyChart, go to [email or web address].    Your next appointment:   4 month(s)  The format for your next appointment:   In Person  Provider:   Chilton Si, MD   Other Instructions  We have referred to the PREP program.   Heart Healthy Diet Recommendations: A low-salt diet is recommended. Meats should be grilled, baked, or boiled. Avoid fried foods. Focus on lean protein sources like fish or chicken with vegetables and fruits. The American Heart Association is a Chief Technology Officer!    Exercise recommendations: The American Heart Association recommends 150 minutes of moderate intensity exercise weekly. Try 30 minutes of moderate intensity exercise 4-5 times per week. This could include walking, jogging, or swimming.

## 2021-07-08 NOTE — Telephone Encounter (Signed)
Called to discuss interest in PREP program; she would like to attend/begin next week at [facility] every M/W 10-11:15am 8/22-11/16/22. Intake Assessment visit scheduled for 8/16 at 10:30am.
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
## 2021-07-15 NOTE — Progress Notes (Signed)
[facility] PREP Weekly Session   Patient Details  Name: Sara Strickland MRN: [id number] Date of Birth: [birth year] Age: 75 y.o. PCP: Sigmund Hazel, MD  There were no vitals filed for this visit.   [name] Weekly seesion - 07/15/21 1600       Weekly Session   Topic Discussed Goal setting and welcome to the program   Introductions, review of PREP notebook and program, tour of facility, light cardio workout followed by stretching             Zilphia Kozinski B Shaneen Reeser 07/15/2021, 4:03 PM

## 2021-07-22 NOTE — Progress Notes (Signed)
[facility] PREP Weekly Session   Patient Details  Name: Sara Strickland MRN: [id number] Date of Birth: 12/30/1945 Age: 75 y.o. PCP: Sigmund Hazel, MD  Vitals:   07/22/21 1440  Weight: 128 lb 9.6 oz (58.3 kg)     [name] Weekly seesion - 07/22/21 1400       Weekly Session   Topic Discussed Importance of resistance training;Other ways to be active;Water    Minutes exercised this week 61 minutes    Classes attended to date 2            Healthy habits: cardio: 150 min/wk by end of 12 weeks; strength training: 2-3 times a week, 20-40 minutes each session by end of 12 wks; water: 64 oz a day, working up to 1/2 body wt in ounces by end of 12 wks  [name] 07/22/2021, 2:41 PM

## 2021-07-29 ENCOUNTER — Telehealth: Payer: Self-pay

## 2021-07-29 NOTE — Progress Notes (Signed)
[facility] PREP Weekly Session   Patient Details  Name: Sara Strickland MRN: [id number] Date of Birth: [birth year] Age: 75 y.o. PCP: Sigmund Hazel, MD  Vitals:   07/29/21 1115  Weight: 127 lb 12.8 oz (58 kg)     [name] Weekly seesion - 07/29/21 1100       Weekly Session   Topic Discussed Healthy eating tips   Honey food label review   Minutes exercised this week 379 minutes    Classes attended to date 4              [name] [name] 07/29/2021, 11:16 AM

## 2021-07-29 NOTE — Telephone Encounter (Signed)
Documentation other instead of phone call.

## 2021-08-05 NOTE — Progress Notes (Signed)
[facility] PREP Weekly Session   Patient Details  Name: Sara Strickland MRN: [id number] Date of Birth: 1946-02-01 Age: 75 y.o. PCP: Sigmund Hazel, MD  Vitals:   08/05/21 1132  Weight: 130 lb 3.2 oz (59.1 kg)     [name] Weekly seesion - 08/05/21 1100       Weekly Session   Topic Discussed Health habits;Water   Sugar demo   Minutes exercised this week 329 minutes    Classes attended to date 31              [name] 08/05/2021, 11:33 AM

## 2021-08-12 NOTE — Progress Notes (Signed)
[facility] PREP Weekly Session   Patient Details  Name: Sara Strickland MRN: [id number] Date of Birth: [birth year] Age: 75 y.o. PCP: Sigmund Hazel, MD  Vitals:   08/12/21 1350  Weight: 129 lb 6.4 oz (58.7 kg)     [name] Weekly seesion - 08/12/21 1300       Weekly Session   Topic Discussed Restaurant Eating   Salt talk/demo   Minutes exercised this week 343 minutes    Classes attended to date 8              Jassen Sarver B Abdirizak Richison 08/12/2021, 1:52 PM

## 2021-08-26 ENCOUNTER — Telehealth: Payer: Self-pay | Admitting: Cardiovascular Disease

## 2021-08-26 NOTE — Telephone Encounter (Signed)
Pt c/o of Chest Pain: STAT if CP now or developed within 24 hours  1. Are you having CP right now? no  2. Are you experiencing any other symptoms (ex. SOB, nausea, vomiting, sweating)? SOB, sweaty during episode, but not currently  3. How long have you been experiencing CP? Saturday  4. Is your CP continuous or coming and going? Came and went  5. Have you taken Nitroglycerin? no   Patient states she had an episode Saturday of tightness in her chest. She states it felt like her chest could not move or expand. She says it was a quarter to 11 am and she was at a [employer] when she started getting hot and sweaty. She states she could not concentrate on her book so she went to the bathroom and put cold water on her face. She states when she came out she could not breathe so she bent over and slid her body to the floor. She states she sat there and felt it getting better and then went home. She says she has not had symptoms since.   ?

## 2021-08-26 NOTE — Progress Notes (Signed)
[facility] PREP Weekly Session  Patient Details  Name: Sara Strickland MRN: [id number] Date of Birth: 1946-01-21 Age: 75 y.o. PCP: Sigmund Hazel, MD  Vitals:   08/26/21 1249  Weight: 128 lb 6.4 oz (58.2 kg)     [facility] Weekly seesion - 08/26/21 1200       [facility] "PREP" Location   [facility] "PREP" Location [name]      Weekly Session   Topic Discussed Stress management and problem solving   Discussed importance of sleep; Shared breathwork hand mudra with group.   Minutes exercised this week 338 minutes    Classes attended to date 10             [name] 08/26/2021, 12:49 PM

## 2021-08-26 NOTE — Telephone Encounter (Signed)
Spoke to patient she stated she had a episode of sob,chest tightness,weakness this past Saturday while at [facility].Stated episode lasted appox 15 min.Stated she feels ok this morning.She has not had anymore episodes since.Appointment scheduled with Juanda Crumble PA 10/7 at 10:30 am at [facility] office.Advised if she has anymore episodes to go to ED.
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
## 2021-08-30 ENCOUNTER — Other Ambulatory Visit: Payer: Self-pay

## 2021-08-30 ENCOUNTER — Ambulatory Visit: Payer: Medicare Other | Admitting: Physician Assistant

## 2021-08-30 ENCOUNTER — Encounter: Payer: Self-pay | Admitting: Physician Assistant

## 2021-08-30 VITALS — BP 104/58 | HR 57 | Ht 60.0 in | Wt 124.0 lb

## 2021-08-30 DIAGNOSIS — R072 Precordial pain: Secondary | ICD-10-CM

## 2021-08-30 DIAGNOSIS — E7800 Pure hypercholesterolemia, unspecified: Secondary | ICD-10-CM | POA: Diagnosis not present

## 2021-08-30 DIAGNOSIS — E782 Mixed hyperlipidemia: Secondary | ICD-10-CM

## 2021-08-30 DIAGNOSIS — R0602 Shortness of breath: Secondary | ICD-10-CM

## 2021-08-30 DIAGNOSIS — I10 Essential (primary) hypertension: Secondary | ICD-10-CM | POA: Diagnosis not present

## 2021-08-30 DIAGNOSIS — I471 Supraventricular tachycardia: Secondary | ICD-10-CM

## 2021-08-30 NOTE — Patient Instructions (Addendum)
Medication Instructions:  Your physician recommends that you continue on your current medications as directed. Please refer to the Current Medication list given to you today.  *If you need a refill on your cardiac medications before your next appointment, please call your pharmacy*  Lab Work: Your physician recommends that you return for lab work TODAY:  BMET  If you have labs (blood work) drawn today and your tests are completely normal, you will receive your results only by: MyChart Message (if you have MyChart) OR A paper copy in the mail If you have any lab test that is abnormal or we need to change your treatment, we will call you to review the results.  Testing/Procedures: Your physician has requested that you have an echocardiogram. Echocardiography is a painless test that uses sound waves to create images of your heart. It provides your doctor with information about the size and shape of your heart and how well your heart's chambers and valves are working. This procedure takes approximately one hour. There are no restrictions for this procedure.  Please schedule for 1-2 weeks    Your physician has requested that you have a lexiscan myoview. For further information please visit [email or web address] Please follow instruction sheet, as given.   Please schedule for 1-2 weeks    Follow-Up: At [facility], you and your health needs are our priority.  As part of our continuing mission to provide you with exceptional heart care, we have created designated Provider Care Teams.  These Care Teams include your primary Cardiologist (physician) and Advanced Practice Providers (APPs -  Physician Assistants and Nurse Practitioners) who all work together to provide you with the care you need, when you need it.  We recommend signing up for the patient portal called "MyChart".  Sign up information is provided on this After Visit Summary.  MyChart is used to connect with patients for Virtual  Visits (Telemedicine).  Patients are able to view lab/test results, encounter notes, upcoming appointments, etc.  Non-urgent messages can be sent to your provider as well.   To learn more about what you can do with MyChart, go to [email or web address].    Your next appointment:   As scheduled 11/07/21 @ 9:20 AM  The format for your next appointment:   In Person  Provider:   Chilton Si, MD  Other Instructions

## 2021-08-30 NOTE — Addendum Note (Signed)
Addended by: Dorris Fetch on: 08/30/2021 01:11 PM   Modules accepted: Orders

## 2021-08-30 NOTE — Addendum Note (Signed)
Addended by: Juanda Crumble on: 08/30/2021 01:13 PM   Modules accepted: Orders

## 2021-08-31 LAB — BASIC METABOLIC PANEL
BUN/Creatinine Ratio: 10 — ABNORMAL LOW (ref 12–28)
BUN: 9 mg/dL (ref 8–27)
CO2: 23 mmol/L (ref 20–29)
Calcium: 8.9 mg/dL (ref 8.7–10.3)
Chloride: 109 mmol/L — ABNORMAL HIGH (ref 96–106)
Creatinine, Ser: 0.86 mg/dL (ref 0.57–1.00)
Glucose: 131 mg/dL — ABNORMAL HIGH (ref 70–99)
Potassium: 4.3 mmol/L (ref 3.5–5.2)
Sodium: 146 mmol/L — ABNORMAL HIGH (ref 134–144)
eGFR: 72 mL/min/{1.73_m2} (ref >59)

## 2021-09-02 NOTE — Progress Notes (Signed)
[facility] PREP Weekly Session  Patient Details  Name: Sara Strickland MRN: [id number] Date of Birth: 10-13-1946 Age: 75 y.o. PCP: Sigmund Hazel, MD  Vitals:   09/02/21 1212  Weight: 130 lb 6.4 oz (59.1 kg)     [facility] Weekly seesion - 09/02/21 1200       [facility] "PREP" Location   [facility] "PREP" Location [name]      Weekly Session   Topic Discussed Expectations and non-scale victories    Minutes exercised this week 327 minutes    Classes attended to date 4             [name] [name] 09/02/2021, 12:13 PM

## 2021-09-04 ENCOUNTER — Telehealth: Payer: Self-pay

## 2021-09-04 NOTE — Telephone Encounter (Addendum)
Called patient regarding Blood Test Results----- Message from Cannon Kettle, PA-C sent at 09/03/2021  3:06 PM EDT ----- Your potassium and your kidney function are normal.  (A previous potassium was elevated).  Continue currents medications.

## 2021-09-09 NOTE — Progress Notes (Signed)
[facility] PREP Weekly Session  Patient Details  Name: Aoife Bold MRN: [id number] Date of Birth: 15-Dec-1945 Age: 75 y.o. PCP: Sigmund Hazel, MD  Vitals:   09/09/21 1123  Weight: 127 lb 9.6 oz (57.9 kg)     [facility] Weekly seesion - 09/09/21 1100       [facility] "PREP" Location   [facility] "PREP[facility] Location [name]      Weekly Session   Topic Discussed Other   Portion control, [facility] visualize your portion size demo, review of nutrition labels   Minutes exercised this week 287 minutes    Classes attended to date 40           Homework for next week: bring in a healthy nutrition label of food consuming at home for group review.   Ashley Royalty Janee Ureste 09/09/2021, 11:24 AM

## 2021-09-10 ENCOUNTER — Telehealth: Payer: Self-pay | Admitting: Cardiovascular Disease

## 2021-09-10 ENCOUNTER — Encounter: Payer: Self-pay | Admitting: *Deleted

## 2021-09-10 ENCOUNTER — Telehealth (HOSPITAL_COMMUNITY): Payer: Self-pay | Admitting: *Deleted

## 2021-09-10 ENCOUNTER — Ambulatory Visit (HOSPITAL_COMMUNITY): Payer: Medicare Other

## 2021-09-10 ENCOUNTER — Encounter (HOSPITAL_COMMUNITY): Payer: Self-pay | Admitting: Cardiovascular Disease

## 2021-09-10 NOTE — Telephone Encounter (Signed)
As indicated below, went over lexiscan instructions with the pt verbatim over the phone.  Pt does not have an active mychart account to send instructions to.  Pt is scheduled for a lexiscan myoview on 10/20 at 12:30 pm at our [facility] location. Pt verbalized understanding of instructions provided to her over the phone, and agrees with this plan.      [facility] at [facility] [address] [place], [place] [address] Phone: [phone]   September 10, 2021     Janyla Biscoe DOB: [birth year] MRN: [id number] [address] [place] [address]    Dear Ms. Tisdell,   You are scheduled for a Myocardial Perfusion Imaging Study on:  09/12/21 at 12:30 pm.  Please arrive 15 minutes prior to your appointment time for registration and insurance purposes.   The test will take approximately 3 to 4 hours to complete; you may bring reading material.  If someone comes with you to your appointment, they will need to remain in the main lobby due to limited space in the testing area. **If you are pregnant or breastfeeding, please notify the nuclear lab prior to your appointment**   How to prepare for your Myocardial Perfusion Test: Do not eat or drink 3 hours prior to your test, except you may have water. Do not consume products containing caffeine (regular or decaffeinated) 12 hours prior to your test. (ex: coffee, chocolate, sodas, tea). Do wear comfortable clothes (no dresses or overalls) and walking shoes, tennis shoes preferred (No heels or open toe shoes are allowed). Do NOT wear cologne, perfume, aftershave, or lotions (deodorant is allowed). If these instructions are not followed, your test will have to be rescheduled.   Please report to [address] [facility], [address] for your test.  If you have questions or concerns about your appointment, you can call the Nuclear Lab at [phone].   If you cannot keep your appointment, please provide 24 hours  notification to the Nuclear Lab, to avoid a possible $50 charge to your account.  [facility] [address]                          [place], [phone]

## 2021-09-10 NOTE — Telephone Encounter (Signed)
New Message:      Patient is scheduled for Myocardial Perfusion on 09-12-21. She wants to know if she takes her morning medicine before her test

## 2021-09-10 NOTE — Telephone Encounter (Signed)
Close encounter 

## 2021-09-11 ENCOUNTER — Ambulatory Visit (INDEPENDENT_AMBULATORY_CARE_PROVIDER_SITE_OTHER): Payer: Medicare Other

## 2021-09-11 ENCOUNTER — Other Ambulatory Visit: Payer: Self-pay

## 2021-09-11 DIAGNOSIS — R0602 Shortness of breath: Secondary | ICD-10-CM

## 2021-09-11 LAB — ECHOCARDIOGRAM COMPLETE
AR max vel: 4.14 cm2
AV Area VTI: 4.37 cm2
AV Area mean vel: 4.05 cm2
AV Mean grad: 5 mmHg
AV Peak grad: 9.6 mmHg
Ao pk vel: 1.55 m/s
Area-P 1/2: 3.76 cm2
Calc EF: 69.8 %
MV M vel: 4.9 m/s
MV Peak grad: 96 mmHg
S' Lateral: 2.97 cm
Single Plane A2C EF: 65.2 %
Single Plane A4C EF: 72.2 %

## 2021-09-12 ENCOUNTER — Ambulatory Visit (HOSPITAL_COMMUNITY)
Admission: RE | Admit: 2021-09-12 | Discharge: 2021-09-12 | Disposition: A | Discharge status: Home / Self Care | Payer: Medicare Other | Source: Ambulatory Visit | Attending: Cardiology | Admitting: Cardiology

## 2021-09-12 ENCOUNTER — Telehealth: Payer: Self-pay

## 2021-09-12 DIAGNOSIS — R072 Precordial pain: Secondary | ICD-10-CM | POA: Diagnosis not present

## 2021-09-12 LAB — MYOCARDIAL PERFUSION IMAGING
Base ST Depression (mm): 0 mm
LV dias vol: 63 mL (ref 46–106)
LV sys vol: 19 mL
Nuc Stress EF: 69 %
Peak HR: 100 {beats}/min
Rest HR: 58 {beats}/min
Rest Nuclear Isotope Dose: 11 mCi
SDS: 0
SRS: 1
SSS: 1
ST Depression (mm): 0 mm
Stress Nuclear Isotope Dose: 30.6 mCi
TID: 1.02

## 2021-09-12 MED ORDER — TECHNETIUM TC 99M TETROFOSMIN IV KIT
30.6000 | PACK | Freq: Once | INTRAVENOUS | Status: AC | PRN
  Administered 2021-09-12: 30.6 via INTRAVENOUS
  Filled 2021-09-12: qty 31

## 2021-09-12 MED ORDER — TECHNETIUM TC 99M TETROFOSMIN IV KIT
11.0000 | PACK | Freq: Once | INTRAVENOUS | Status: AC | PRN
  Administered 2021-09-12: 11 via INTRAVENOUS
  Filled 2021-09-12: qty 11

## 2021-09-12 MED ORDER — REGADENOSON 0.4 MG/5ML IV SOLN
0.4000 mg | Freq: Once | INTRAVENOUS | Status: AC
  Administered 2021-09-12: 0.4 mg via INTRAVENOUS

## 2021-09-12 NOTE — Telephone Encounter (Addendum)
Called patient left detailed message regarding echocardiogram results.----- Message from Cannon Kettle, PA-C sent at 09/12/2021 12:57 PM EDT ----- Your heart squeezes normally and is normal size.  No significant valve disease.  There is no structural abnormality to cause shortness of breath or lightheadedness.  This is good news.

## 2021-09-16 NOTE — Progress Notes (Signed)
[facility] PREP Weekly Session  Patient Details  Name: Sara Strickland MRN: [id number] Date of Birth: 1946/07/10 Age: 75 y.o. PCP: Sigmund Hazel, MD  Vitals:   09/16/21 1106  Weight: 131 lb 12.8 oz (59.8 kg)     [facility] Weekly seesion - 09/16/21 1100       [facility] "PREP" Location   [facility] "PREP" Location [name]      Weekly Session   Topic Discussed Finding support   Review of food labels brought in by each partcipant of program   Minutes exercised this week 311 minutes    Classes attended to date 45             Dreon Pineda B Taiden Raybourn 09/16/2021, 11:07 AM

## 2021-09-18 ENCOUNTER — Telehealth: Payer: Self-pay

## 2021-09-18 NOTE — Telephone Encounter (Addendum)
Spoke with patient regarding results.----- Message from Cannon Kettle, PA-C sent at 09/17/2021  9:29 PM EDT ----- Your stress test was negative.  No evidence of heart artery blockage. Good news.

## 2021-09-23 NOTE — Progress Notes (Signed)
[facility] PREP Weekly Session  Patient Details  Name: Sara Strickland MRN: [id number] Date of Birth: 1946-02-06 Age: 75 y.o. PCP: Sigmund Hazel, MD  Vitals:   09/23/21 1143  Weight: 128 lb 3.2 oz (58.2 kg)     [facility] Weekly seesion - 09/23/21 1100       [facility] "PREP" Location   [employer] "[facility] Location [name]      Weekly Session   Topic Discussed Calorie breakdown   fat/muscle comparison   Minutes exercised this week 317 minutes    Classes attended to date 54             Nicasio Barlowe B Destynee Stringfellow 09/23/2021, 11:44 AM

## 2021-09-30 NOTE — Progress Notes (Signed)
[facility] PRE[facility] Weekly Session  Patient Details  Name: Sara Strickland MRN: [id number] Date of Birth: May 02, 1946 Age: 75 y.o. PCP: Sigmund Hazel, MD  Vitals:   09/30/21 1129  Weight: 130 lb 9.6 oz (59.2 kg)     [facility] Weekly seesion - 09/30/21 1100       [facility] "PREP" Location   [facility]" Location Sara Strickland      Weekly Session   Topic Discussed Hitting roadblocks   Membership talk with Sara Strickland; reviewed goals and activity plan for next 90 days, to bring to final assessment vist Wednesday 11/16   Minutes exercised this week 385 minutes    Classes attended to date 66             Sara Strickland B Sara Strickland 09/30/2021, 11:30 AM

## 2021-10-07 NOTE — Progress Notes (Signed)
[facility] PREP Weekly Session  Patient Details  Name: Sara Strickland MRN: [id number] Date of Birth: 03-13-1946 Age: 75 y.o. PCP: Sara Hazel, MD  Vitals:   10/07/21 1118  Weight: 127 lb 3.2 oz (57.7 kg)     [facility] Weekly seesion - 10/07/21 1100       [facility] "PREP" Location   [facility] Sara Strickland      Weekly Session   Topic Discussed Other   How fit and strong survery completed; fit testing completed; review of final assessment visit on Wednesday to include filling out and bringing goals and activity plan for next 90 days and PREP Class survey   Minutes exercised this week 299 minutes    Classes attended to date 35             Sara Strickland Sara Strickland 10/07/2021, 11:19 AM
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
## 2021-10-21 DIAGNOSIS — M810 Age-related osteoporosis without current pathological fracture: Secondary | ICD-10-CM | POA: Diagnosis not present

## 2021-10-21 DIAGNOSIS — E1122 Type 2 diabetes mellitus with diabetic chronic kidney disease: Secondary | ICD-10-CM | POA: Diagnosis not present

## 2021-10-21 DIAGNOSIS — G250 Essential tremor: Secondary | ICD-10-CM | POA: Diagnosis not present

## 2021-10-21 DIAGNOSIS — E7800 Pure hypercholesterolemia, unspecified: Secondary | ICD-10-CM | POA: Diagnosis not present

## 2021-10-21 DIAGNOSIS — K219 Gastro-esophageal reflux disease without esophagitis: Secondary | ICD-10-CM | POA: Diagnosis not present

## 2021-10-21 DIAGNOSIS — Z79899 Other long term (current) drug therapy: Secondary | ICD-10-CM | POA: Diagnosis not present

## 2021-10-22 DIAGNOSIS — E785 Hyperlipidemia, unspecified: Secondary | ICD-10-CM | POA: Diagnosis not present

## 2021-10-22 DIAGNOSIS — N1831 Chronic kidney disease, stage 3a: Secondary | ICD-10-CM | POA: Diagnosis not present

## 2021-10-22 DIAGNOSIS — M810 Age-related osteoporosis without current pathological fracture: Secondary | ICD-10-CM | POA: Diagnosis not present

## 2021-10-22 DIAGNOSIS — E7800 Pure hypercholesterolemia, unspecified: Secondary | ICD-10-CM | POA: Diagnosis not present

## 2021-10-22 DIAGNOSIS — E1122 Type 2 diabetes mellitus with diabetic chronic kidney disease: Secondary | ICD-10-CM | POA: Diagnosis not present

## 2021-10-22 DIAGNOSIS — I129 Hypertensive chronic kidney disease with stage 1 through stage 4 chronic kidney disease, or unspecified chronic kidney disease: Secondary | ICD-10-CM | POA: Diagnosis not present

## 2021-10-22 DIAGNOSIS — K219 Gastro-esophageal reflux disease without esophagitis: Secondary | ICD-10-CM | POA: Diagnosis not present

## 2021-11-07 ENCOUNTER — Other Ambulatory Visit: Payer: Self-pay

## 2021-11-07 ENCOUNTER — Encounter (HOSPITAL_BASED_OUTPATIENT_CLINIC_OR_DEPARTMENT_OTHER): Payer: Self-pay | Admitting: Cardiovascular Disease

## 2021-11-07 ENCOUNTER — Ambulatory Visit (HOSPITAL_BASED_OUTPATIENT_CLINIC_OR_DEPARTMENT_OTHER): Payer: Medicare Other | Admitting: Cardiovascular Disease

## 2021-11-07 DIAGNOSIS — R42 Dizziness and giddiness: Secondary | ICD-10-CM | POA: Diagnosis not present

## 2021-11-07 DIAGNOSIS — I471 Supraventricular tachycardia: Secondary | ICD-10-CM

## 2021-11-07 DIAGNOSIS — E782 Mixed hyperlipidemia: Secondary | ICD-10-CM

## 2021-11-07 DIAGNOSIS — I10 Essential (primary) hypertension: Secondary | ICD-10-CM

## 2021-11-07 NOTE — Assessment & Plan Note (Signed)
Carotid Dopplers are normal.  She seems to have tied these episodes to her low blood sugars and being extremely active and not eating.  Symptoms improve when she eats.  No further cardiac work-up needed at this time.

## 2021-11-07 NOTE — Assessment & Plan Note (Signed)
Symptoms are well controlled on metoprolol.

## 2021-11-07 NOTE — Assessment & Plan Note (Signed)
Continue simvastatin. 
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
## 2021-11-07 NOTE — Assessment & Plan Note (Signed)
Blood pressure is above goal today.  She should be <130/80.  However has been generally well controlled at home.  She is had blood pressures as low as the 110s.  Therefore we will not titrate her medicine at this time.  Continue lisinopril and metoprolol.

## 2021-11-07 NOTE — Patient Instructions (Signed)
Medication Instructions:  ?Your physician recommends that you continue on your current medications as directed. Please refer to the Current Medication list given to you today.  ? ?*If you need a refill on your cardiac medications before your next appointment, please call your pharmacy* ? ?Lab Work: ?NONE ? ?Testing/Procedures: ?NONE ? ?Follow-Up: ?At [facility], you and your health needs are our priority.  As part of our continuing mission to provide you with exceptional heart care, we have created designated Provider Care Teams.  These Care Teams include your primary Cardiologist (physician) and Advanced Practice Providers (APPs -  Physician Assistants and Nurse Practitioners) who all work together to provide you with the care you need, when you need it. ? ?We recommend signing up for the patient portal called "MyChart".  Sign up information is provided on this After Visit Summary.  MyChart is used to connect with patients for Virtual Visits (Telemedicine).  Patients are able to view lab/test results, encounter notes, upcoming appointments, etc.  Non-urgent messages can be sent to your provider as well.   ?To learn more about what you can do with MyChart, go to [email or web address].   ? ?Your next appointment:   ?12 month(s) ? ?The format for your next appointment:   ?In Person ? ?Provider:   ?Tiffany Addison, MD  ? ? ? ? ? ? ?

## 2022-02-07 DIAGNOSIS — H43813 Vitreous degeneration, bilateral: Secondary | ICD-10-CM | POA: Diagnosis not present

## 2022-02-07 DIAGNOSIS — H02834 Dermatochalasis of left upper eyelid: Secondary | ICD-10-CM | POA: Diagnosis not present

## 2022-02-07 DIAGNOSIS — H02831 Dermatochalasis of right upper eyelid: Secondary | ICD-10-CM | POA: Diagnosis not present

## 2022-02-07 DIAGNOSIS — H04123 Dry eye syndrome of bilateral lacrimal glands: Secondary | ICD-10-CM | POA: Diagnosis not present

## 2022-02-07 DIAGNOSIS — Z961 Presence of intraocular lens: Secondary | ICD-10-CM | POA: Diagnosis not present

## 2022-02-07 DIAGNOSIS — E119 Type 2 diabetes mellitus without complications: Secondary | ICD-10-CM | POA: Diagnosis not present

## 2022-02-07 DIAGNOSIS — H35372 Puckering of macula, left eye: Secondary | ICD-10-CM | POA: Diagnosis not present

## 2022-02-07 DIAGNOSIS — H25811 Combined forms of age-related cataract, right eye: Secondary | ICD-10-CM | POA: Diagnosis not present

## 2022-02-07 DIAGNOSIS — H532 Diplopia: Secondary | ICD-10-CM | POA: Diagnosis not present

## 2022-05-06 DIAGNOSIS — M810 Age-related osteoporosis without current pathological fracture: Secondary | ICD-10-CM | POA: Diagnosis not present

## 2022-05-06 DIAGNOSIS — E7800 Pure hypercholesterolemia, unspecified: Secondary | ICD-10-CM | POA: Diagnosis not present

## 2022-05-06 DIAGNOSIS — I129 Hypertensive chronic kidney disease with stage 1 through stage 4 chronic kidney disease, or unspecified chronic kidney disease: Secondary | ICD-10-CM | POA: Diagnosis not present

## 2022-05-06 DIAGNOSIS — G250 Essential tremor: Secondary | ICD-10-CM | POA: Diagnosis not present

## 2022-05-06 DIAGNOSIS — Z1159 Encounter for screening for other viral diseases: Secondary | ICD-10-CM | POA: Diagnosis not present

## 2022-05-06 DIAGNOSIS — R7303 Prediabetes: Secondary | ICD-10-CM | POA: Diagnosis not present

## 2022-05-06 DIAGNOSIS — Z0000 Encounter for general adult medical examination without abnormal findings: Secondary | ICD-10-CM | POA: Diagnosis not present

## 2022-05-06 DIAGNOSIS — Z79899 Other long term (current) drug therapy: Secondary | ICD-10-CM | POA: Diagnosis not present

## 2022-08-08 DIAGNOSIS — E7800 Pure hypercholesterolemia, unspecified: Secondary | ICD-10-CM | POA: Diagnosis not present

## 2022-11-05 DIAGNOSIS — I129 Hypertensive chronic kidney disease with stage 1 through stage 4 chronic kidney disease, or unspecified chronic kidney disease: Secondary | ICD-10-CM | POA: Diagnosis not present

## 2022-11-05 DIAGNOSIS — I471 Supraventricular tachycardia, unspecified: Secondary | ICD-10-CM | POA: Diagnosis not present

## 2022-11-05 DIAGNOSIS — N1831 Chronic kidney disease, stage 3a: Secondary | ICD-10-CM | POA: Diagnosis not present

## 2022-11-05 DIAGNOSIS — M810 Age-related osteoporosis without current pathological fracture: Secondary | ICD-10-CM | POA: Diagnosis not present

## 2022-11-05 DIAGNOSIS — E7800 Pure hypercholesterolemia, unspecified: Secondary | ICD-10-CM | POA: Diagnosis not present

## 2022-11-05 DIAGNOSIS — R7303 Prediabetes: Secondary | ICD-10-CM | POA: Diagnosis not present

## 2023-05-12 DIAGNOSIS — M810 Age-related osteoporosis without current pathological fracture: Secondary | ICD-10-CM | POA: Diagnosis not present

## 2023-05-12 DIAGNOSIS — I471 Supraventricular tachycardia, unspecified: Secondary | ICD-10-CM | POA: Diagnosis not present

## 2023-05-12 DIAGNOSIS — E7800 Pure hypercholesterolemia, unspecified: Secondary | ICD-10-CM | POA: Diagnosis not present

## 2023-05-12 DIAGNOSIS — R7303 Prediabetes: Secondary | ICD-10-CM | POA: Diagnosis not present

## 2023-05-12 DIAGNOSIS — Z0000 Encounter for general adult medical examination without abnormal findings: Secondary | ICD-10-CM | POA: Diagnosis not present

## 2023-05-12 DIAGNOSIS — I10 Essential (primary) hypertension: Secondary | ICD-10-CM | POA: Diagnosis not present

## 2023-05-14 DIAGNOSIS — Z1211 Encounter for screening for malignant neoplasm of colon: Secondary | ICD-10-CM | POA: Diagnosis not present

## 2023-05-25 DIAGNOSIS — H35372 Puckering of macula, left eye: Secondary | ICD-10-CM | POA: Diagnosis not present

## 2023-05-25 DIAGNOSIS — H532 Diplopia: Secondary | ICD-10-CM | POA: Diagnosis not present

## 2023-05-25 DIAGNOSIS — H43813 Vitreous degeneration, bilateral: Secondary | ICD-10-CM | POA: Diagnosis not present

## 2023-05-25 DIAGNOSIS — E119 Type 2 diabetes mellitus without complications: Secondary | ICD-10-CM | POA: Diagnosis not present

## 2023-05-25 DIAGNOSIS — H25811 Combined forms of age-related cataract, right eye: Secondary | ICD-10-CM | POA: Diagnosis not present

## 2023-05-25 DIAGNOSIS — H02831 Dermatochalasis of right upper eyelid: Secondary | ICD-10-CM | POA: Diagnosis not present

## 2023-05-25 DIAGNOSIS — H02834 Dermatochalasis of left upper eyelid: Secondary | ICD-10-CM | POA: Diagnosis not present

## 2023-05-25 DIAGNOSIS — Z961 Presence of intraocular lens: Secondary | ICD-10-CM | POA: Diagnosis not present

## 2023-05-25 DIAGNOSIS — H04123 Dry eye syndrome of bilateral lacrimal glands: Secondary | ICD-10-CM | POA: Diagnosis not present

## 2023-06-16 DIAGNOSIS — Z1231 Encounter for screening mammogram for malignant neoplasm of breast: Secondary | ICD-10-CM | POA: Diagnosis not present

## 2023-06-16 DIAGNOSIS — I10 Essential (primary) hypertension: Secondary | ICD-10-CM | POA: Diagnosis not present

## 2023-06-16 DIAGNOSIS — M810 Age-related osteoporosis without current pathological fracture: Secondary | ICD-10-CM | POA: Diagnosis not present

## 2023-06-16 DIAGNOSIS — Z1239 Encounter for other screening for malignant neoplasm of breast: Secondary | ICD-10-CM | POA: Diagnosis not present

## 2023-06-16 DIAGNOSIS — E1169 Type 2 diabetes mellitus with other specified complication: Secondary | ICD-10-CM | POA: Diagnosis not present

## 2023-06-16 DIAGNOSIS — E7800 Pure hypercholesterolemia, unspecified: Secondary | ICD-10-CM | POA: Diagnosis not present

## 2023-06-16 DIAGNOSIS — R92333 Mammographic heterogeneous density, bilateral breasts: Secondary | ICD-10-CM | POA: Diagnosis not present

## 2023-07-20 DIAGNOSIS — M810 Age-related osteoporosis without current pathological fracture: Secondary | ICD-10-CM | POA: Diagnosis not present

## 2023-07-20 DIAGNOSIS — E119 Type 2 diabetes mellitus without complications: Secondary | ICD-10-CM | POA: Diagnosis not present

## 2023-11-13 DIAGNOSIS — E119 Type 2 diabetes mellitus without complications: Secondary | ICD-10-CM | POA: Diagnosis not present

## 2023-11-13 DIAGNOSIS — E7800 Pure hypercholesterolemia, unspecified: Secondary | ICD-10-CM | POA: Diagnosis not present

## 2023-11-13 DIAGNOSIS — I10 Essential (primary) hypertension: Secondary | ICD-10-CM | POA: Diagnosis not present

## 2023-11-13 DIAGNOSIS — M810 Age-related osteoporosis without current pathological fracture: Secondary | ICD-10-CM | POA: Diagnosis not present

## 2023-11-17 DIAGNOSIS — R7303 Prediabetes: Secondary | ICD-10-CM | POA: Diagnosis not present

## 2023-12-16 DIAGNOSIS — K5900 Constipation, unspecified: Secondary | ICD-10-CM | POA: Diagnosis not present

## 2024-05-16 DIAGNOSIS — Z0000 Encounter for general adult medical examination without abnormal findings: Secondary | ICD-10-CM | POA: Diagnosis not present

## 2024-05-17 DIAGNOSIS — E119 Type 2 diabetes mellitus without complications: Secondary | ICD-10-CM | POA: Diagnosis not present

## 2024-05-17 DIAGNOSIS — I10 Essential (primary) hypertension: Secondary | ICD-10-CM | POA: Diagnosis not present

## 2024-05-17 DIAGNOSIS — Z8639 Personal history of other endocrine, nutritional and metabolic disease: Secondary | ICD-10-CM | POA: Diagnosis not present

## 2024-05-17 DIAGNOSIS — E7800 Pure hypercholesterolemia, unspecified: Secondary | ICD-10-CM | POA: Diagnosis not present

## 2024-05-17 DIAGNOSIS — M810 Age-related osteoporosis without current pathological fracture: Secondary | ICD-10-CM | POA: Diagnosis not present

## 2024-05-23 DIAGNOSIS — E1169 Type 2 diabetes mellitus with other specified complication: Secondary | ICD-10-CM | POA: Diagnosis not present

## 2024-05-23 DIAGNOSIS — M810 Age-related osteoporosis without current pathological fracture: Secondary | ICD-10-CM | POA: Diagnosis not present

## 2024-05-23 DIAGNOSIS — E7800 Pure hypercholesterolemia, unspecified: Secondary | ICD-10-CM | POA: Diagnosis not present

## 2024-06-23 DIAGNOSIS — E1169 Type 2 diabetes mellitus with other specified complication: Secondary | ICD-10-CM | POA: Diagnosis not present

## 2024-06-23 DIAGNOSIS — M810 Age-related osteoporosis without current pathological fracture: Secondary | ICD-10-CM | POA: Diagnosis not present

## 2024-06-23 DIAGNOSIS — E7800 Pure hypercholesterolemia, unspecified: Secondary | ICD-10-CM | POA: Diagnosis not present

## 2024-07-24 DIAGNOSIS — M810 Age-related osteoporosis without current pathological fracture: Secondary | ICD-10-CM | POA: Diagnosis not present

## 2024-07-24 DIAGNOSIS — E1169 Type 2 diabetes mellitus with other specified complication: Secondary | ICD-10-CM | POA: Diagnosis not present

## 2024-07-24 DIAGNOSIS — E7800 Pure hypercholesterolemia, unspecified: Secondary | ICD-10-CM | POA: Diagnosis not present

## 2024-08-04 DIAGNOSIS — H2513 Age-related nuclear cataract, bilateral: Secondary | ICD-10-CM | POA: Diagnosis not present

## 2024-08-23 DIAGNOSIS — E1169 Type 2 diabetes mellitus with other specified complication: Secondary | ICD-10-CM | POA: Diagnosis not present

## 2024-08-23 DIAGNOSIS — M810 Age-related osteoporosis without current pathological fracture: Secondary | ICD-10-CM | POA: Diagnosis not present

## 2024-08-23 DIAGNOSIS — E7800 Pure hypercholesterolemia, unspecified: Secondary | ICD-10-CM | POA: Diagnosis not present

## 2024-11-22 NOTE — Progress Notes (Signed)
 Sara Strickland                                          MRN: [id number]   11/22/2024   The [facility] Quality Team Specialist reviewed this patient medical record for the purposes of chart review for care gap closure. The following were reviewed: chart review for care gap closure-kidney health evaluation for diabetes:eGFR  and uACR.    [facility]BCI Quality Team

## 2025-01-23 ENCOUNTER — Ambulatory Visit (HOSPITAL_BASED_OUTPATIENT_CLINIC_OR_DEPARTMENT_OTHER): Admitting: Cardiovascular Disease
